# Patient Record
Sex: Female | Born: 1970 | Race: Black or African American | Hispanic: No | Marital: Single | Smoking: Never smoker
Health system: Southern US, Community
[De-identification: ages and names within clinical notes are randomized; demographics above are authoritative.]

## PROBLEM LIST (undated history)

## (undated) DIAGNOSIS — I10 Essential (primary) hypertension: Secondary | ICD-10-CM

## (undated) DIAGNOSIS — F419 Anxiety disorder, unspecified: Secondary | ICD-10-CM

## (undated) HISTORY — PX: CHOLECYSTECTOMY: SHX55

---

## 2007-01-16 ENCOUNTER — Other Ambulatory Visit: Admission: RE | Admit: 2007-01-16 | Discharge: 2007-01-16 | Payer: Self-pay | Admitting: Family Medicine

## 2008-03-15 ENCOUNTER — Other Ambulatory Visit: Admission: RE | Admit: 2008-03-15 | Discharge: 2008-03-15 | Payer: Self-pay | Admitting: Family Medicine

## 2009-03-20 ENCOUNTER — Other Ambulatory Visit: Admission: RE | Admit: 2009-03-20 | Discharge: 2009-03-20 | Payer: Self-pay | Admitting: Family Medicine

## 2011-02-22 ENCOUNTER — Other Ambulatory Visit (HOSPITAL_COMMUNITY)
Admission: RE | Admit: 2011-02-22 | Discharge: 2011-02-22 | Disposition: A | Discharge status: Home / Self Care | Payer: 59 | Source: Ambulatory Visit | Attending: Family Medicine | Admitting: Family Medicine

## 2011-02-22 DIAGNOSIS — Z124 Encounter for screening for malignant neoplasm of cervix: Secondary | ICD-10-CM | POA: Insufficient documentation

## 2012-03-03 ENCOUNTER — Other Ambulatory Visit (HOSPITAL_COMMUNITY)
Admission: RE | Admit: 2012-03-03 | Discharge: 2012-03-03 | Disposition: A | Discharge status: Home / Self Care | Payer: 59 | Source: Ambulatory Visit | Attending: Family Medicine | Admitting: Family Medicine

## 2012-03-03 DIAGNOSIS — Z124 Encounter for screening for malignant neoplasm of cervix: Secondary | ICD-10-CM | POA: Insufficient documentation

## 2012-11-06 ENCOUNTER — Inpatient Hospital Stay (HOSPITAL_COMMUNITY)
Admission: AD | Admit: 2012-11-06 | Discharge: 2012-11-06 | Disposition: A | Discharge status: Home / Self Care | Payer: 59 | Source: Ambulatory Visit | Attending: Obstetrics and Gynecology | Admitting: Obstetrics and Gynecology

## 2012-11-06 ENCOUNTER — Encounter (HOSPITAL_COMMUNITY): Payer: Self-pay | Admitting: *Deleted

## 2012-11-06 DIAGNOSIS — N946 Dysmenorrhea, unspecified: Secondary | ICD-10-CM

## 2012-11-06 DIAGNOSIS — N949 Unspecified condition associated with female genital organs and menstrual cycle: Secondary | ICD-10-CM | POA: Insufficient documentation

## 2012-11-06 DIAGNOSIS — N938 Other specified abnormal uterine and vaginal bleeding: Secondary | ICD-10-CM | POA: Insufficient documentation

## 2012-11-06 DIAGNOSIS — R51 Headache: Secondary | ICD-10-CM | POA: Insufficient documentation

## 2012-11-06 DIAGNOSIS — I10 Essential (primary) hypertension: Secondary | ICD-10-CM | POA: Insufficient documentation

## 2012-11-06 HISTORY — DX: Anxiety disorder, unspecified: F41.9

## 2012-11-06 HISTORY — DX: Essential (primary) hypertension: I10

## 2012-11-06 LAB — URINE MICROSCOPIC-ADD ON

## 2012-11-06 LAB — URINALYSIS, ROUTINE W REFLEX MICROSCOPIC
Glucose, UA: NEGATIVE mg/dL
Ketones, ur: 15 mg/dL — AB
Protein, ur: 100 mg/dL — AB

## 2012-11-06 MED ORDER — IBUPROFEN 800 MG PO TABS
800.0000 mg | ORAL_TABLET | Freq: Once | ORAL | Status: AC
  Administered 2012-11-06: 800 mg via ORAL
  Filled 2012-11-06: qty 1

## 2012-11-06 MED ORDER — AMLODIPINE BESYLATE 5 MG PO TABS: 5.0000 mg | ORAL_TABLET | Freq: Every day | ORAL | Status: AC

## 2012-11-06 MED ORDER — LABETALOL HCL 5 MG/ML IV SOLN
20.0000 mg | Freq: Once | INTRAVENOUS | Status: AC
  Administered 2012-11-06: 20 mg via INTRAVENOUS
  Filled 2012-11-06: qty 4

## 2012-11-06 MED ORDER — SODIUM CHLORIDE 0.9 % IV SOLN
INTRAVENOUS | Status: DC
  Administered 2012-11-06: 20 mL/h via INTRAVENOUS

## 2012-11-06 MED ORDER — LABETALOL HCL 5 MG/ML IV SOLN
40.0000 mg | INTRAVENOUS | Status: AC
  Administered 2012-11-06: 20 mg via INTRAVENOUS
  Administered 2012-11-06: 40 mg via INTRAVENOUS
  Filled 2012-11-06: qty 4
  Filled 2012-11-06: qty 8

## 2012-11-06 MED ORDER — LISINOPRIL 10 MG PO TABS: 20.0000 mg | ORAL_TABLET | Freq: Every day | ORAL | Status: AC

## 2012-11-06 NOTE — MAU Provider Note (Signed)
Attestation of Attending Supervision of Advanced Practitioner (CNM/NP): Evaluation and management procedures were performed by the Advanced Practitioner under my supervision and collaboration.  I have reviewed the Advanced Practitioner's note and chart, and I agree with the management and plan.  Anderson Coppock 11/06/2012 7:27 AM

## 2012-11-06 NOTE — MAU Note (Signed)
Pt started bleeding and cramping Wednesday.  LMP March 15.  Spotted most of April.

## 2012-11-06 NOTE — MAU Provider Note (Signed)
  History     CSN: [id number]  Arrival date and time: 11/06/12 4098   First Provider Initiated Contact with Patient 11/06/12 [phone]      Chief Complaint  Patient presents with  . Vaginal Bleeding  . Dysmenorrhea   HPI  Cerys Winget is a 42 y.o. G0P0 who presents today with bleeding and cramping. She was not sure if she was pregnant because her menstrual cycle has been irregular. She also reports a history of chronic hypertension. She states she was on lisinopril 20mg /day and amlodipine 5mg /day , and that her PA took her off her meds "a couple of months ago". She states that she was going to try being off her meds and seeing if she could tolerate it. She has had a headache for the last "few days".   Past Medical History  Diagnosis Date  . Hypertension   . Anxiety     Past Surgical History  Procedure Laterality Date  . Cholecystectomy      Family History  Problem Relation Age of Onset  . Hypertension Mother   . Diabetes Father   . Hypertension Father   . Cancer Father     History  Substance Use Topics  . Smoking status: Never Smoker   . Smokeless tobacco: Not on file  . Alcohol Use: No    Allergies: No Known Allergies  Prescriptions prior to admission  Medication Sig Dispense Refill  . omeprazole (PRILOSEC) 40 MG capsule Take 40 mg by mouth daily.      . sertraline (ZOLOFT) 25 MG tablet Take 25 mg by mouth daily.        Review of Systems  Eyes: Negative for blurred vision and double vision.  Respiratory: Negative for shortness of breath.   Cardiovascular: Negative for chest pain.  Gastrointestinal: Negative for nausea, vomiting and abdominal pain.  Genitourinary: Negative for dysuria, urgency and frequency.  Neurological: Positive for headaches. Negative for dizziness.   Physical Exam   Blood pressure 230/126, pulse 76, temperature 99.9 F (37.7 C), temperature source Oral, resp. rate 18, height 4\' 11"  (1.499 m), last menstrual period  09/17/2012.  Physical Exam  Nursing note and vitals reviewed. Constitutional: She is oriented to person, place, and time. She appears well-developed and well-nourished. No distress.  Cardiovascular: Normal rate.   Respiratory: Effort normal.  GI: Soft. There is no tenderness.  Neurological: She is alert and oriented to person, place, and time.  Skin: Skin is warm and dry.  Psychiatric: She has a normal mood and affect.    MAU Course  Procedures  Results for orders placed during the hospital encounter of 11/06/12 (from the past 24 hour(s))  POCT PREGNANCY, URINE     Status: None   Collection Time    11/06/12  3:41 AM      Result Value Range   Preg Test, Ur NEGATIVE  NEGATIVE    0350: Spoke with Dr. Jolayne Panther. IV labetalol 40 mg X 2 doses. If still need to bring B/P down can do 10mg  of hydralazine and 10 mins later 20 mg PRN. Once b/p is within 150-160/100 range patient can be dc home with rx for meds and to FU with PCP.   0549: B/P has been stable in the 170s/90's Assessment and Plan   1. Hypertension    Resume Lisinopril and amlodipine starting tomorrow Call PCP and inform them of update in B/P Be seen there as soon as possible.   Tawnya Crook 11/06/2012, 3:51 AM

## 2013-03-12 ENCOUNTER — Other Ambulatory Visit (HOSPITAL_COMMUNITY)
Admission: RE | Admit: 2013-03-12 | Discharge: 2013-03-12 | Disposition: A | Discharge status: Home / Self Care | Payer: 59 | Source: Ambulatory Visit | Attending: Family Medicine | Admitting: Family Medicine

## 2013-03-12 ENCOUNTER — Other Ambulatory Visit: Payer: Self-pay | Admitting: Physician Assistant

## 2013-03-12 DIAGNOSIS — Z124 Encounter for screening for malignant neoplasm of cervix: Secondary | ICD-10-CM | POA: Insufficient documentation

## 2013-10-07 ENCOUNTER — Other Ambulatory Visit (HOSPITAL_COMMUNITY): Payer: Self-pay | Admitting: Radiology

## 2013-10-07 ENCOUNTER — Ambulatory Visit (HOSPITAL_COMMUNITY): Payer: 59 | Attending: Physician Assistant | Admitting: Radiology

## 2013-10-07 DIAGNOSIS — R011 Cardiac murmur, unspecified: Secondary | ICD-10-CM | POA: Insufficient documentation

## 2013-10-07 NOTE — Progress Notes (Signed)
Echocardiogram Performed. 

## 2014-03-21 ENCOUNTER — Other Ambulatory Visit: Payer: Self-pay | Admitting: Physician Assistant

## 2014-03-21 ENCOUNTER — Other Ambulatory Visit (HOSPITAL_COMMUNITY)
Admission: RE | Admit: 2014-03-21 | Discharge: 2014-03-21 | Disposition: A | Discharge status: Home / Self Care | Payer: 59 | Source: Ambulatory Visit | Attending: Family Medicine | Admitting: Family Medicine

## 2014-03-21 DIAGNOSIS — Z124 Encounter for screening for malignant neoplasm of cervix: Secondary | ICD-10-CM | POA: Diagnosis not present

## 2014-03-22 LAB — CYTOLOGY - PAP

## 2015-04-25 ENCOUNTER — Other Ambulatory Visit: Payer: Self-pay | Admitting: Physician Assistant

## 2015-04-25 DIAGNOSIS — R928 Other abnormal and inconclusive findings on diagnostic imaging of breast: Secondary | ICD-10-CM

## 2015-05-01 ENCOUNTER — Ambulatory Visit
Admission: RE | Admit: 2015-05-01 | Discharge: 2015-05-01 | Disposition: A | Discharge status: Home / Self Care | Payer: 59 | Source: Ambulatory Visit | Attending: Physician Assistant | Admitting: Physician Assistant

## 2015-05-01 DIAGNOSIS — R928 Other abnormal and inconclusive findings on diagnostic imaging of breast: Secondary | ICD-10-CM

## 2019-09-06 ENCOUNTER — Other Ambulatory Visit: Payer: Self-pay | Admitting: Obstetrics & Gynecology

## 2019-09-06 DIAGNOSIS — Z1231 Encounter for screening mammogram for malignant neoplasm of breast: Secondary | ICD-10-CM

## 2019-10-05 ENCOUNTER — Ambulatory Visit
Admission: RE | Admit: 2019-10-05 | Discharge: 2019-10-05 | Disposition: A | Discharge status: Home / Self Care | Payer: 59 | Source: Ambulatory Visit | Attending: Obstetrics & Gynecology | Admitting: Obstetrics & Gynecology

## 2019-10-05 ENCOUNTER — Other Ambulatory Visit: Payer: Self-pay

## 2019-10-05 DIAGNOSIS — Z1231 Encounter for screening mammogram for malignant neoplasm of breast: Secondary | ICD-10-CM

## 2021-07-01 IMAGING — MG DIGITAL SCREENING BILAT W/ TOMO W/ CAD
6 of 12 series · 6 of 36 positions shown · non-contrast
Comparison: Previous exam(s).

CLINICAL DATA: Screening.

EXAM:
DIGITAL SCREENING BILATERAL MAMMOGRAM WITH TOMO AND CAD

[R MLO synth-2D]
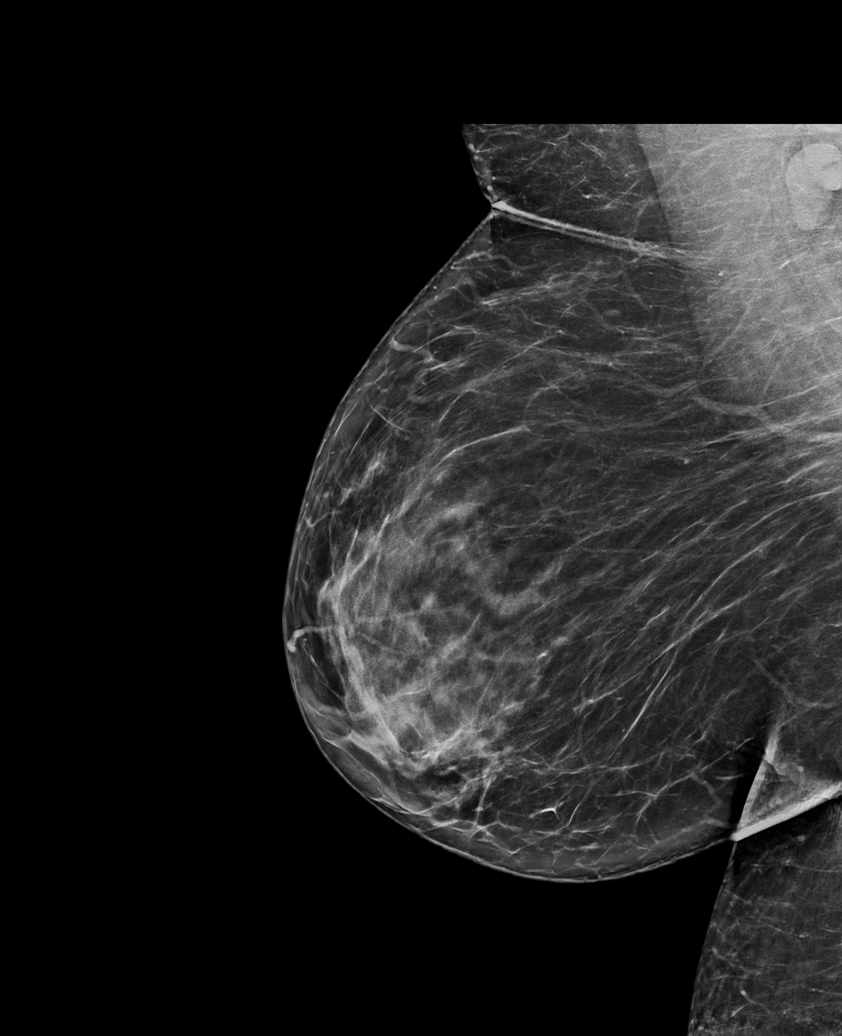

[L CC synth-2D (1 of 2)]
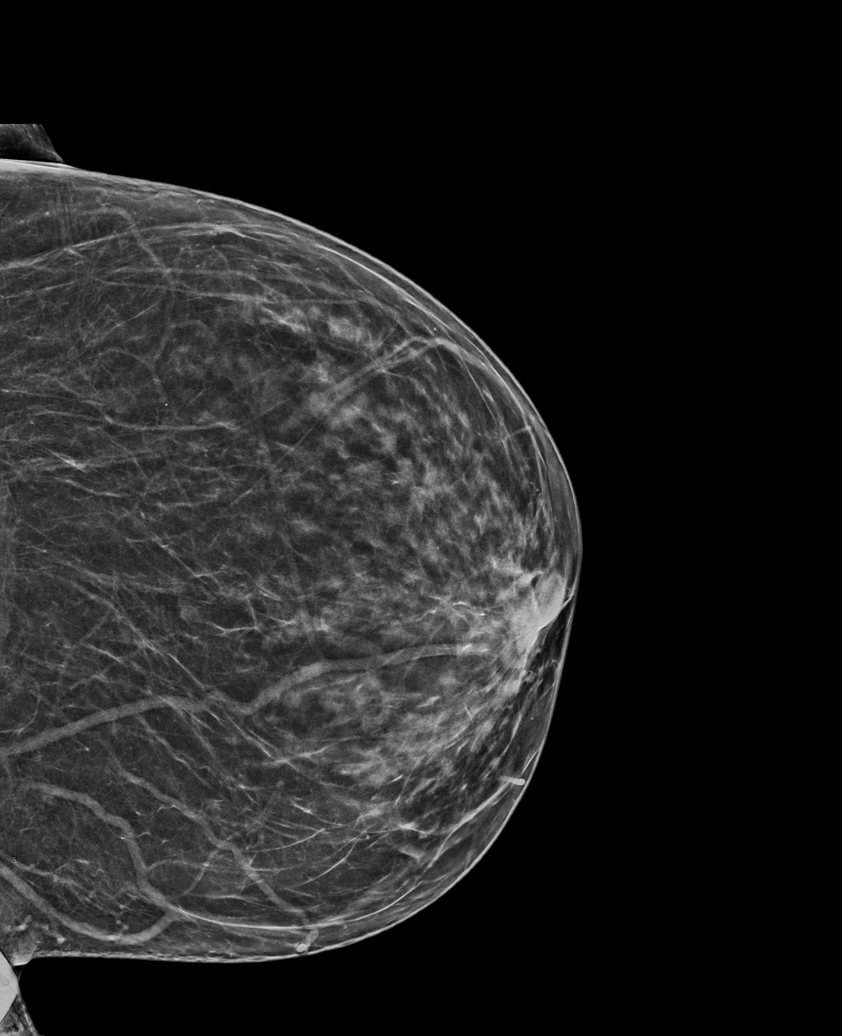

[L CC synth-2D (2 of 2)]
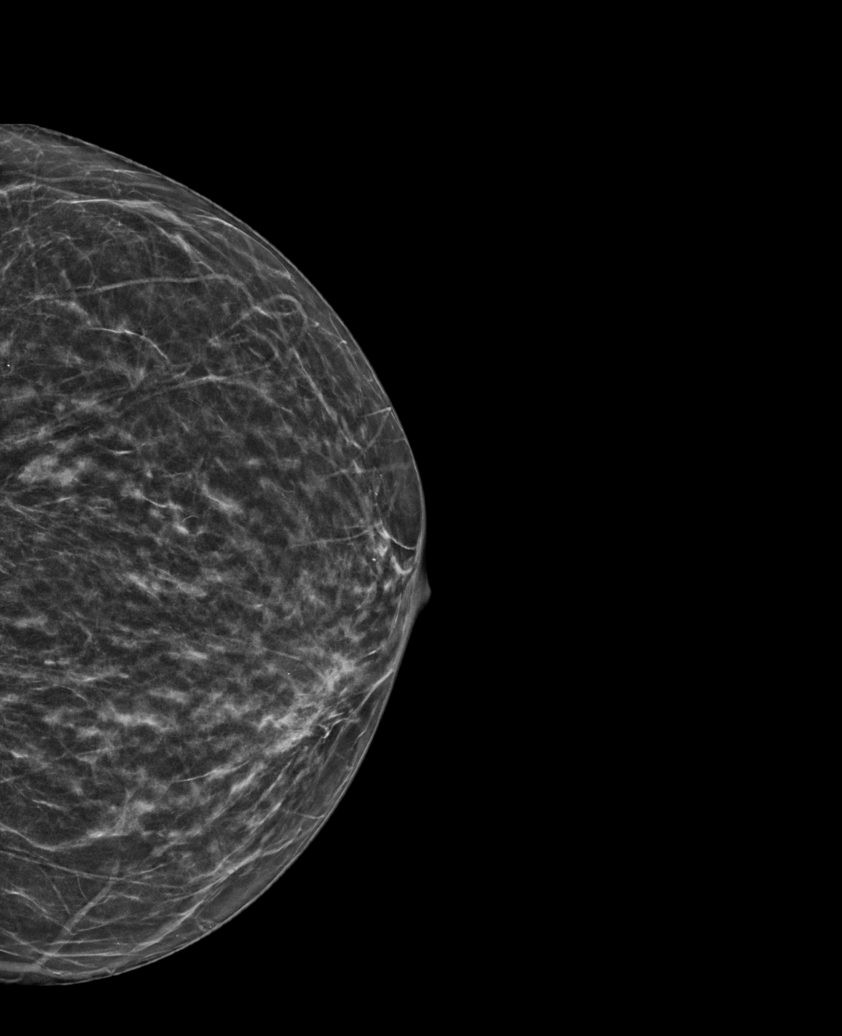

[R CC synth-2D (1 of 2)]
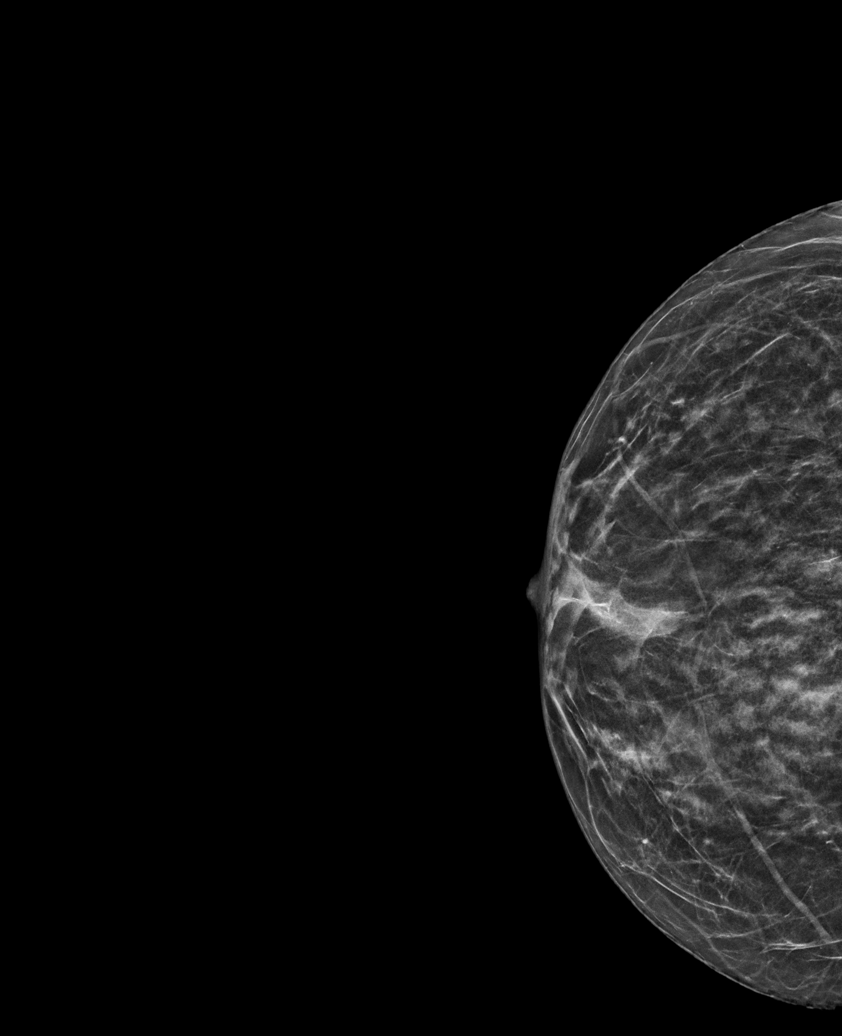

[L MLO synth-2D]
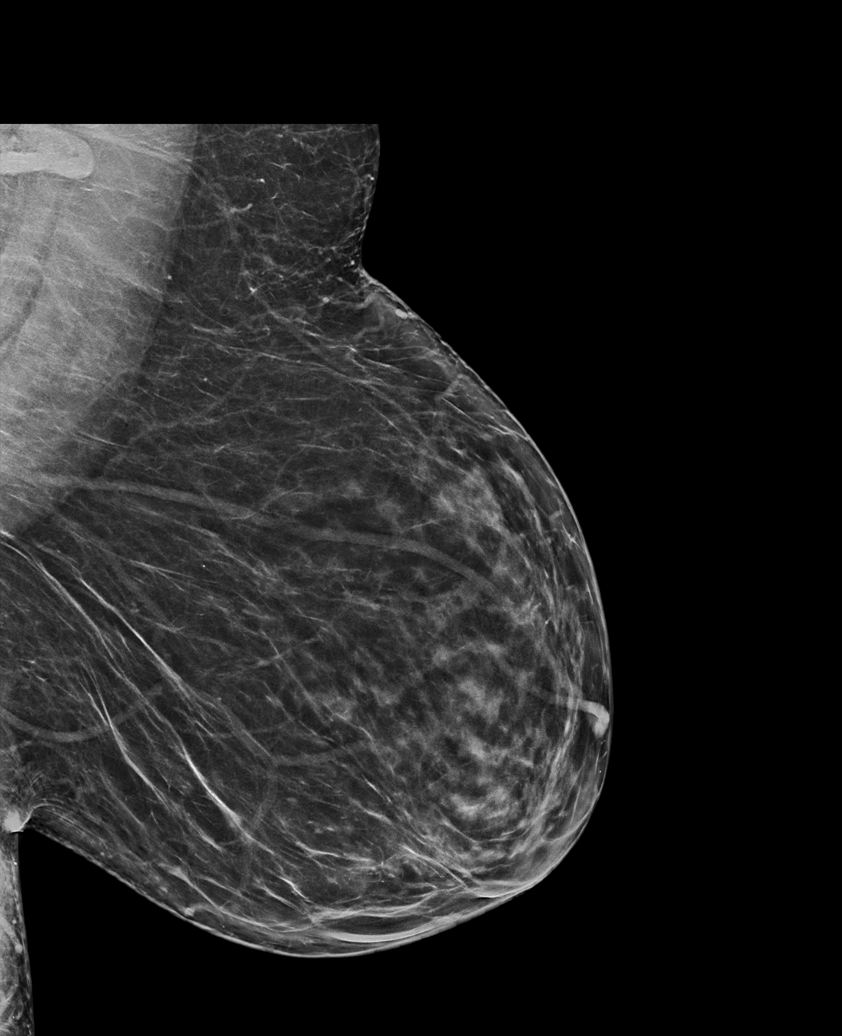

[R CC synth-2D (2 of 2)]
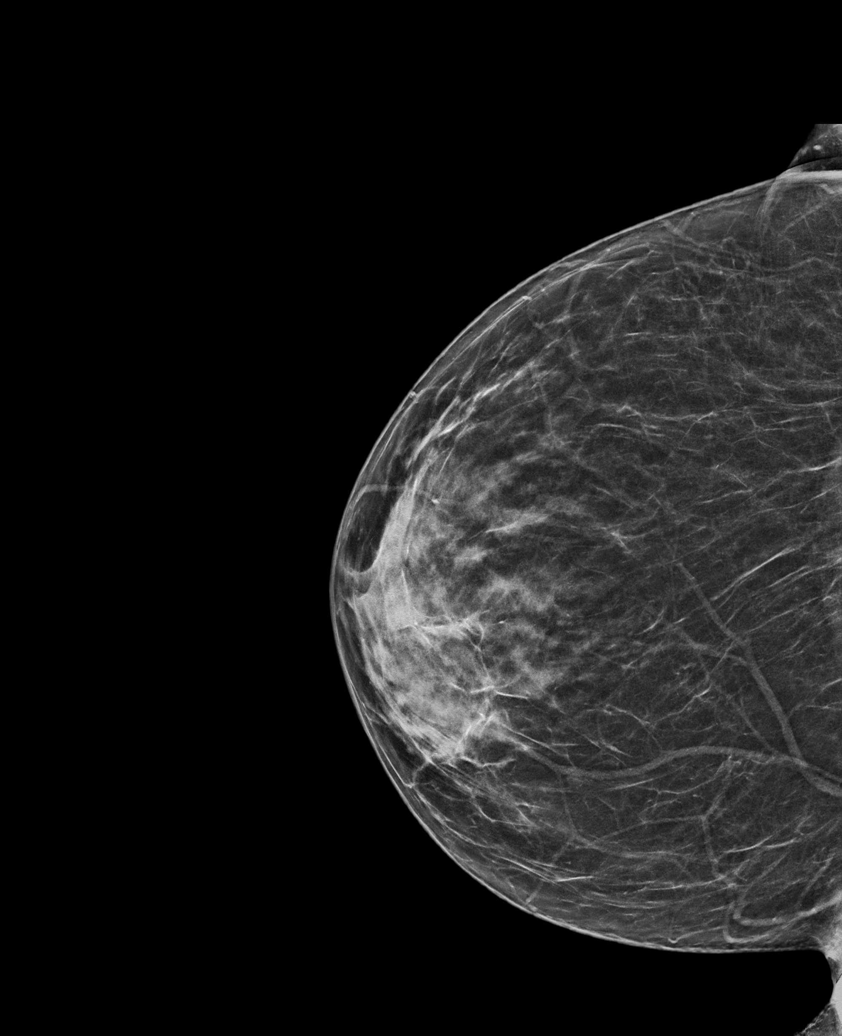

[6 of 36 positions shown; findings below may reference images not displayed]

ACR Breast Density Category c: The breast tissue is heterogeneously
dense, which may obscure small masses.
FINDINGS: There are no findings suspicious for malignancy. Images were
processed with CAD.
IMPRESSION: No mammographic evidence of malignancy. A result letter of this
screening mammogram will be mailed directly to the patient.

RECOMMENDATION:
Screening mammogram in one year. (Code:[id number])

BI-RADS CATEGORY  1: Negative.

## 2021-09-19 ENCOUNTER — Other Ambulatory Visit: Payer: Self-pay

## 2021-09-19 MED ORDER — AMLODIPINE BESYLATE 5 MG PO TABS
ORAL_TABLET | ORAL | 0 refills | Status: AC
  Filled 2021-09-19: qty 30, 30d supply, fill #0
  Filled 2021-10-29: qty 60, 60d supply, fill #1

## 2021-09-19 MED ORDER — LOSARTAN POTASSIUM-HCTZ 100-25 MG PO TABS
ORAL_TABLET | ORAL | 2 refills | Status: AC
  Filled 2021-09-19 – 2022-02-08 (×2): qty 30, 30d supply, fill #0
  Filled 2022-03-14: qty 30, 30d supply, fill #1

## 2021-09-19 MED ORDER — LOSARTAN POTASSIUM 100 MG PO TABS
ORAL_TABLET | ORAL | 1 refills | Status: AC
  Filled 2021-09-19: qty 30, 30d supply, fill #0

## 2021-09-19 MED ORDER — SERTRALINE HCL 100 MG PO TABS
ORAL_TABLET | ORAL | 1 refills | Status: AC
  Filled 2021-09-19 – 2021-10-10 (×2): qty 30, 30d supply, fill #0
  Filled 2022-02-08: qty 30, 30d supply, fill #1

## 2021-09-19 MED ORDER — NYSTATIN 100000 UNIT/GM EX POWD
CUTANEOUS | 2 refills | Status: AC
  Filled 2021-09-19: qty 60, 30d supply, fill #0

## 2021-09-19 MED ORDER — SERTRALINE HCL 50 MG PO TABS
ORAL_TABLET | ORAL | 1 refills | Status: AC
  Filled 2021-09-19: qty 30, 30d supply, fill #0

## 2021-09-19 MED ORDER — HYDROCHLOROTHIAZIDE 25 MG PO TABS
ORAL_TABLET | ORAL | 8 refills | Status: AC
  Filled 2021-09-19: qty 30, 30d supply, fill #0

## 2021-09-19 MED ORDER — PANTOPRAZOLE SODIUM 40 MG PO TBEC
40.0000 mg | DELAYED_RELEASE_TABLET | Freq: Every day | ORAL | 0 refills | Status: AC
  Filled 2021-09-19: qty 30, 30d supply, fill #0
  Filled 2022-02-08: qty 30, 30d supply, fill #1

## 2021-09-20 ENCOUNTER — Other Ambulatory Visit: Payer: Self-pay

## 2021-10-10 ENCOUNTER — Other Ambulatory Visit (HOSPITAL_COMMUNITY): Payer: Self-pay

## 2021-10-10 ENCOUNTER — Other Ambulatory Visit: Payer: Self-pay

## 2021-10-10 MED ORDER — PANTOPRAZOLE SODIUM 40 MG PO TBEC
40.0000 mg | DELAYED_RELEASE_TABLET | Freq: Two times a day (BID) | ORAL | 0 refills | Status: AC
  Filled 2021-10-10: qty 180, 90d supply, fill #0

## 2021-10-11 ENCOUNTER — Other Ambulatory Visit: Payer: Self-pay

## 2021-10-23 ENCOUNTER — Other Ambulatory Visit: Payer: Self-pay

## 2021-10-23 MED ORDER — LOSARTAN POTASSIUM-HCTZ 100-25 MG PO TABS
ORAL_TABLET | ORAL | 0 refills | Status: DC
  Filled 2021-10-23: qty 90, 90d supply, fill #0

## 2021-10-29 ENCOUNTER — Other Ambulatory Visit: Payer: Self-pay

## 2021-10-30 DIAGNOSIS — I10 Essential (primary) hypertension: Secondary | ICD-10-CM | POA: Diagnosis not present

## 2021-10-30 DIAGNOSIS — G4733 Obstructive sleep apnea (adult) (pediatric): Secondary | ICD-10-CM | POA: Diagnosis not present

## 2021-10-30 DIAGNOSIS — R69 Illness, unspecified: Secondary | ICD-10-CM | POA: Diagnosis not present

## 2021-11-02 DIAGNOSIS — G4733 Obstructive sleep apnea (adult) (pediatric): Secondary | ICD-10-CM | POA: Diagnosis not present

## 2021-11-05 DIAGNOSIS — I10 Essential (primary) hypertension: Secondary | ICD-10-CM | POA: Diagnosis not present

## 2021-11-05 DIAGNOSIS — E119 Type 2 diabetes mellitus without complications: Secondary | ICD-10-CM | POA: Diagnosis not present

## 2021-11-09 DIAGNOSIS — G4733 Obstructive sleep apnea (adult) (pediatric): Secondary | ICD-10-CM | POA: Diagnosis not present

## 2021-11-12 ENCOUNTER — Other Ambulatory Visit: Payer: Self-pay

## 2021-11-12 MED ORDER — SERTRALINE HCL 100 MG PO TABS
ORAL_TABLET | ORAL | 1 refills | Status: AC
  Filled 2021-11-12 – 2022-01-12 (×2): qty 30, 30d supply, fill #0
  Filled 2022-01-14 – 2022-03-14 (×2): qty 30, 30d supply, fill #1
  Filled 2022-04-21: qty 30, 30d supply, fill #2
  Filled 2022-05-23: qty 30, 30d supply, fill #3

## 2021-11-19 ENCOUNTER — Other Ambulatory Visit: Payer: Self-pay

## 2021-12-02 DIAGNOSIS — G4733 Obstructive sleep apnea (adult) (pediatric): Secondary | ICD-10-CM | POA: Diagnosis not present

## 2021-12-10 DIAGNOSIS — G4733 Obstructive sleep apnea (adult) (pediatric): Secondary | ICD-10-CM | POA: Diagnosis not present

## 2021-12-10 DIAGNOSIS — I10 Essential (primary) hypertension: Secondary | ICD-10-CM | POA: Diagnosis not present

## 2021-12-10 DIAGNOSIS — R69 Illness, unspecified: Secondary | ICD-10-CM | POA: Diagnosis not present

## 2021-12-25 DIAGNOSIS — R140 Abdominal distension (gaseous): Secondary | ICD-10-CM | POA: Diagnosis not present

## 2021-12-25 DIAGNOSIS — Z789 Other specified health status: Secondary | ICD-10-CM | POA: Diagnosis not present

## 2022-01-02 DIAGNOSIS — G4733 Obstructive sleep apnea (adult) (pediatric): Secondary | ICD-10-CM | POA: Diagnosis not present

## 2022-01-03 DIAGNOSIS — R109 Unspecified abdominal pain: Secondary | ICD-10-CM | POA: Diagnosis not present

## 2022-01-03 DIAGNOSIS — R197 Diarrhea, unspecified: Secondary | ICD-10-CM | POA: Diagnosis not present

## 2022-01-04 DIAGNOSIS — R109 Unspecified abdominal pain: Secondary | ICD-10-CM | POA: Diagnosis not present

## 2022-01-04 DIAGNOSIS — R140 Abdominal distension (gaseous): Secondary | ICD-10-CM | POA: Diagnosis not present

## 2022-01-07 ENCOUNTER — Other Ambulatory Visit: Payer: Self-pay | Admitting: Family Medicine

## 2022-01-07 DIAGNOSIS — R140 Abdominal distension (gaseous): Secondary | ICD-10-CM

## 2022-01-11 ENCOUNTER — Ambulatory Visit
Admission: RE | Admit: 2022-01-11 | Discharge: 2022-01-11 | Disposition: A | Discharge status: Home / Self Care | Payer: 59 | Source: Ambulatory Visit | Attending: Family Medicine | Admitting: Family Medicine

## 2022-01-11 DIAGNOSIS — R140 Abdominal distension (gaseous): Secondary | ICD-10-CM

## 2022-01-12 ENCOUNTER — Other Ambulatory Visit (HOSPITAL_COMMUNITY): Payer: Self-pay

## 2022-01-14 ENCOUNTER — Other Ambulatory Visit (HOSPITAL_COMMUNITY): Payer: Self-pay

## 2022-01-15 ENCOUNTER — Other Ambulatory Visit (HOSPITAL_COMMUNITY): Payer: Self-pay

## 2022-01-21 DIAGNOSIS — R109 Unspecified abdominal pain: Secondary | ICD-10-CM | POA: Diagnosis not present

## 2022-01-21 DIAGNOSIS — E118 Type 2 diabetes mellitus with unspecified complications: Secondary | ICD-10-CM | POA: Diagnosis not present

## 2022-01-21 DIAGNOSIS — E8881 Metabolic syndrome: Secondary | ICD-10-CM | POA: Diagnosis not present

## 2022-01-21 DIAGNOSIS — E559 Vitamin D deficiency, unspecified: Secondary | ICD-10-CM | POA: Diagnosis not present

## 2022-01-21 DIAGNOSIS — G4733 Obstructive sleep apnea (adult) (pediatric): Secondary | ICD-10-CM | POA: Diagnosis not present

## 2022-01-21 DIAGNOSIS — R946 Abnormal results of thyroid function studies: Secondary | ICD-10-CM | POA: Diagnosis not present

## 2022-01-21 DIAGNOSIS — R69 Illness, unspecified: Secondary | ICD-10-CM | POA: Diagnosis not present

## 2022-01-21 DIAGNOSIS — D649 Anemia, unspecified: Secondary | ICD-10-CM | POA: Diagnosis not present

## 2022-01-21 DIAGNOSIS — M549 Dorsalgia, unspecified: Secondary | ICD-10-CM | POA: Diagnosis not present

## 2022-01-21 DIAGNOSIS — I10 Essential (primary) hypertension: Secondary | ICD-10-CM | POA: Diagnosis not present

## 2022-01-24 DIAGNOSIS — G4733 Obstructive sleep apnea (adult) (pediatric): Secondary | ICD-10-CM | POA: Diagnosis not present

## 2022-02-01 DIAGNOSIS — G4733 Obstructive sleep apnea (adult) (pediatric): Secondary | ICD-10-CM | POA: Diagnosis not present

## 2022-02-04 DIAGNOSIS — R109 Unspecified abdominal pain: Secondary | ICD-10-CM | POA: Diagnosis not present

## 2022-02-04 DIAGNOSIS — R140 Abdominal distension (gaseous): Secondary | ICD-10-CM | POA: Diagnosis not present

## 2022-02-04 DIAGNOSIS — R946 Abnormal results of thyroid function studies: Secondary | ICD-10-CM | POA: Diagnosis not present

## 2022-02-08 ENCOUNTER — Other Ambulatory Visit (HOSPITAL_COMMUNITY): Payer: Self-pay

## 2022-02-08 MED ORDER — AMLODIPINE BESYLATE 5 MG PO TABS
ORAL_TABLET | ORAL | 1 refills | Status: AC
  Filled 2022-02-08: qty 30, 30d supply, fill #0
  Filled 2022-03-14: qty 30, 30d supply, fill #1
  Filled 2022-11-11 – 2022-11-18 (×3): qty 30, 30d supply, fill #2
  Filled 2022-12-20: qty 30, 30d supply, fill #3

## 2022-02-13 ENCOUNTER — Other Ambulatory Visit: Payer: Self-pay

## 2022-02-13 ENCOUNTER — Other Ambulatory Visit (HOSPITAL_COMMUNITY): Payer: Self-pay

## 2022-02-18 DIAGNOSIS — I10 Essential (primary) hypertension: Secondary | ICD-10-CM | POA: Diagnosis not present

## 2022-02-18 DIAGNOSIS — G4733 Obstructive sleep apnea (adult) (pediatric): Secondary | ICD-10-CM | POA: Diagnosis not present

## 2022-02-18 DIAGNOSIS — R69 Illness, unspecified: Secondary | ICD-10-CM | POA: Diagnosis not present

## 2022-02-25 DIAGNOSIS — R140 Abdominal distension (gaseous): Secondary | ICD-10-CM | POA: Diagnosis not present

## 2022-02-25 DIAGNOSIS — E8881 Metabolic syndrome: Secondary | ICD-10-CM | POA: Diagnosis not present

## 2022-02-25 DIAGNOSIS — R1084 Generalized abdominal pain: Secondary | ICD-10-CM | POA: Diagnosis not present

## 2022-02-25 DIAGNOSIS — E559 Vitamin D deficiency, unspecified: Secondary | ICD-10-CM | POA: Diagnosis not present

## 2022-02-25 DIAGNOSIS — M549 Dorsalgia, unspecified: Secondary | ICD-10-CM | POA: Diagnosis not present

## 2022-02-25 DIAGNOSIS — F419 Anxiety disorder, unspecified: Secondary | ICD-10-CM | POA: Diagnosis not present

## 2022-02-25 DIAGNOSIS — I10 Essential (primary) hypertension: Secondary | ICD-10-CM | POA: Diagnosis not present

## 2022-02-25 DIAGNOSIS — D509 Iron deficiency anemia, unspecified: Secondary | ICD-10-CM | POA: Diagnosis not present

## 2022-02-25 DIAGNOSIS — G4733 Obstructive sleep apnea (adult) (pediatric): Secondary | ICD-10-CM | POA: Diagnosis not present

## 2022-02-25 DIAGNOSIS — R946 Abnormal results of thyroid function studies: Secondary | ICD-10-CM | POA: Diagnosis not present

## 2022-02-25 DIAGNOSIS — E118 Type 2 diabetes mellitus with unspecified complications: Secondary | ICD-10-CM | POA: Diagnosis not present

## 2022-02-28 ENCOUNTER — Other Ambulatory Visit: Payer: Self-pay

## 2022-03-14 ENCOUNTER — Other Ambulatory Visit: Payer: Self-pay

## 2022-03-14 ENCOUNTER — Other Ambulatory Visit (HOSPITAL_COMMUNITY): Payer: Self-pay

## 2022-03-14 MED ORDER — PANTOPRAZOLE SODIUM 40 MG PO TBEC
40.0000 mg | DELAYED_RELEASE_TABLET | Freq: Every day | ORAL | 4 refills | Status: AC
  Filled 2022-03-14 – 2022-04-21 (×2): qty 30, 30d supply, fill #0
  Filled 2022-05-23: qty 30, 30d supply, fill #1
  Filled 2022-06-19: qty 30, 30d supply, fill #2
  Filled 2022-07-20: qty 30, 30d supply, fill #3
  Filled 2022-08-30: qty 30, 30d supply, fill #0
  Filled 2022-08-30: qty 30, 30d supply, fill #4

## 2022-03-14 MED ORDER — AMLODIPINE BESYLATE 5 MG PO TABS
5.0000 mg | ORAL_TABLET | Freq: Every day | ORAL | 4 refills | Status: DC
  Filled 2022-04-21: qty 30, 30d supply, fill #0
  Filled 2022-05-23: qty 30, 30d supply, fill #1
  Filled 2022-08-30 (×2): qty 30, 30d supply, fill #2
  Filled 2022-10-05: qty 30, 30d supply, fill #3
  Filled 2022-11-11: qty 30, 30d supply, fill #4

## 2022-03-15 ENCOUNTER — Other Ambulatory Visit: Payer: Self-pay

## 2022-03-15 ENCOUNTER — Other Ambulatory Visit (HOSPITAL_COMMUNITY): Payer: Self-pay

## 2022-03-15 MED ORDER — PANTOPRAZOLE SODIUM 40 MG PO TBEC
40.0000 mg | DELAYED_RELEASE_TABLET | Freq: Two times a day (BID) | ORAL | 0 refills | Status: DC
  Filled 2022-03-15: qty 60, 30d supply, fill #0
  Filled 2022-09-19: qty 60, 30d supply, fill #1
  Filled 2022-10-29: qty 60, 30d supply, fill #2

## 2022-03-19 ENCOUNTER — Other Ambulatory Visit (HOSPITAL_COMMUNITY): Payer: Self-pay

## 2022-04-01 ENCOUNTER — Other Ambulatory Visit (HOSPITAL_COMMUNITY): Payer: Self-pay

## 2022-04-03 ENCOUNTER — Other Ambulatory Visit: Payer: Self-pay

## 2022-04-21 ENCOUNTER — Other Ambulatory Visit (HOSPITAL_COMMUNITY): Payer: Self-pay

## 2022-04-22 ENCOUNTER — Other Ambulatory Visit (HOSPITAL_COMMUNITY): Payer: Self-pay

## 2022-04-25 ENCOUNTER — Other Ambulatory Visit (HOSPITAL_COMMUNITY): Payer: Self-pay

## 2022-04-27 ENCOUNTER — Other Ambulatory Visit (HOSPITAL_COMMUNITY): Payer: Self-pay

## 2022-04-29 ENCOUNTER — Other Ambulatory Visit (HOSPITAL_COMMUNITY): Payer: Self-pay

## 2022-04-29 MED ORDER — LOSARTAN POTASSIUM-HCTZ 100-25 MG PO TABS
1.0000 | ORAL_TABLET | Freq: Every day | ORAL | 0 refills | Status: DC
Start: 2022-04-28 — End: 2022-12-24
  Filled 2022-04-29 – 2022-05-23 (×2): qty 30, 30d supply, fill #0

## 2022-05-02 ENCOUNTER — Other Ambulatory Visit (HOSPITAL_COMMUNITY): Payer: Self-pay

## 2022-05-11 ENCOUNTER — Other Ambulatory Visit (HOSPITAL_COMMUNITY): Payer: Self-pay

## 2022-05-23 ENCOUNTER — Other Ambulatory Visit (HOSPITAL_COMMUNITY): Payer: Self-pay

## 2022-06-25 ENCOUNTER — Other Ambulatory Visit: Payer: Self-pay

## 2022-07-20 ENCOUNTER — Other Ambulatory Visit: Payer: Self-pay

## 2022-07-22 ENCOUNTER — Other Ambulatory Visit (HOSPITAL_COMMUNITY): Payer: Self-pay

## 2022-07-24 ENCOUNTER — Other Ambulatory Visit (HOSPITAL_COMMUNITY): Payer: Self-pay

## 2022-08-07 ENCOUNTER — Other Ambulatory Visit: Payer: Self-pay

## 2022-08-08 ENCOUNTER — Other Ambulatory Visit: Payer: Self-pay

## 2022-08-08 ENCOUNTER — Other Ambulatory Visit (HOSPITAL_COMMUNITY): Payer: Self-pay

## 2022-08-08 MED ORDER — TRULICITY 0.75 MG/0.5ML ~~LOC~~ SOAJ
SUBCUTANEOUS | 1 refills | Status: AC
  Filled 2022-08-08: qty 2, 28d supply, fill #0
  Filled 2022-09-08 – 2022-09-09 (×2): qty 2, 28d supply, fill #1

## 2022-08-14 ENCOUNTER — Other Ambulatory Visit: Payer: Self-pay

## 2022-08-30 ENCOUNTER — Other Ambulatory Visit (HOSPITAL_COMMUNITY): Payer: Self-pay

## 2022-08-30 ENCOUNTER — Other Ambulatory Visit: Payer: Self-pay

## 2022-09-09 ENCOUNTER — Other Ambulatory Visit: Payer: Self-pay

## 2022-09-11 ENCOUNTER — Other Ambulatory Visit: Payer: Self-pay

## 2022-09-19 ENCOUNTER — Other Ambulatory Visit (HOSPITAL_COMMUNITY): Payer: Self-pay

## 2022-10-05 ENCOUNTER — Other Ambulatory Visit (HOSPITAL_COMMUNITY): Payer: Self-pay

## 2022-10-06 ENCOUNTER — Other Ambulatory Visit (HOSPITAL_COMMUNITY): Payer: Self-pay

## 2022-10-07 ENCOUNTER — Other Ambulatory Visit (HOSPITAL_COMMUNITY): Payer: Self-pay

## 2022-10-07 ENCOUNTER — Other Ambulatory Visit: Payer: Self-pay

## 2022-10-07 MED ORDER — TRULICITY 0.75 MG/0.5ML ~~LOC~~ SOAJ
0.7500 mg | SUBCUTANEOUS | 3 refills | Status: AC
  Filled 2022-10-07 – 2022-11-26 (×7): qty 2, 28d supply, fill #0
  Filled 2022-12-20 – 2022-12-27 (×2): qty 2, 28d supply, fill #1
  Filled 2023-04-08 (×2): qty 2, 28d supply, fill #2

## 2022-10-11 ENCOUNTER — Other Ambulatory Visit (HOSPITAL_COMMUNITY): Payer: Self-pay

## 2022-10-11 MED ORDER — CLINDAMYCIN PHOSPHATE 1 % EX LOTN
TOPICAL_LOTION | CUTANEOUS | 0 refills | Status: AC
  Filled 2022-10-11: qty 60, 30d supply, fill #0

## 2022-10-12 ENCOUNTER — Other Ambulatory Visit (HOSPITAL_COMMUNITY): Payer: Self-pay

## 2022-10-14 ENCOUNTER — Other Ambulatory Visit: Payer: Self-pay

## 2022-10-14 ENCOUNTER — Other Ambulatory Visit (HOSPITAL_COMMUNITY): Payer: Self-pay

## 2022-10-17 ENCOUNTER — Other Ambulatory Visit (HOSPITAL_COMMUNITY): Payer: Self-pay

## 2022-10-17 ENCOUNTER — Other Ambulatory Visit: Payer: Self-pay

## 2022-10-30 ENCOUNTER — Other Ambulatory Visit (HOSPITAL_COMMUNITY): Payer: Self-pay

## 2022-11-06 ENCOUNTER — Other Ambulatory Visit (HOSPITAL_COMMUNITY): Payer: Self-pay

## 2022-11-08 ENCOUNTER — Other Ambulatory Visit: Payer: Self-pay

## 2022-11-08 ENCOUNTER — Other Ambulatory Visit (HOSPITAL_COMMUNITY): Payer: Self-pay

## 2022-11-11 ENCOUNTER — Other Ambulatory Visit: Payer: Self-pay

## 2022-11-11 ENCOUNTER — Encounter: Payer: Self-pay | Admitting: Pharmacist

## 2022-11-15 ENCOUNTER — Other Ambulatory Visit: Payer: Self-pay

## 2022-11-18 ENCOUNTER — Other Ambulatory Visit (HOSPITAL_COMMUNITY): Payer: Self-pay

## 2022-11-19 ENCOUNTER — Other Ambulatory Visit: Payer: Self-pay

## 2022-11-26 ENCOUNTER — Other Ambulatory Visit: Payer: Self-pay

## 2022-12-20 ENCOUNTER — Other Ambulatory Visit (HOSPITAL_COMMUNITY): Payer: Self-pay

## 2022-12-23 ENCOUNTER — Encounter: Payer: Self-pay | Admitting: Pharmacist

## 2022-12-23 ENCOUNTER — Other Ambulatory Visit: Payer: Self-pay

## 2022-12-24 ENCOUNTER — Other Ambulatory Visit (HOSPITAL_COMMUNITY): Payer: Self-pay

## 2022-12-24 ENCOUNTER — Other Ambulatory Visit: Payer: Self-pay

## 2022-12-24 MED ORDER — LOSARTAN POTASSIUM-HCTZ 100-25 MG PO TABS
1.0000 | ORAL_TABLET | Freq: Every day | ORAL | 0 refills | Status: AC
Start: 2022-12-23 — End: ?
  Filled 2022-12-24: qty 30, 30d supply, fill #0

## 2022-12-26 ENCOUNTER — Other Ambulatory Visit: Payer: Self-pay

## 2022-12-27 ENCOUNTER — Other Ambulatory Visit (HOSPITAL_COMMUNITY): Payer: Self-pay

## 2022-12-30 ENCOUNTER — Other Ambulatory Visit: Payer: Self-pay

## 2022-12-31 ENCOUNTER — Other Ambulatory Visit (HOSPITAL_COMMUNITY): Payer: Self-pay

## 2022-12-31 ENCOUNTER — Other Ambulatory Visit: Payer: Self-pay

## 2022-12-31 MED ORDER — PANTOPRAZOLE SODIUM 40 MG PO TBEC
40.0000 mg | DELAYED_RELEASE_TABLET | Freq: Two times a day (BID) | ORAL | 0 refills | Status: AC
  Filled 2022-12-31: qty 60, 30d supply, fill #0

## 2023-04-08 ENCOUNTER — Other Ambulatory Visit: Payer: Self-pay

## 2023-04-08 ENCOUNTER — Other Ambulatory Visit (HOSPITAL_COMMUNITY): Payer: Self-pay

## 2023-04-09 ENCOUNTER — Other Ambulatory Visit: Payer: Self-pay

## 2023-04-09 ENCOUNTER — Other Ambulatory Visit (HOSPITAL_COMMUNITY): Payer: Self-pay

## 2023-05-27 ENCOUNTER — Other Ambulatory Visit: Payer: Self-pay

## 2023-06-26 ENCOUNTER — Other Ambulatory Visit (HOSPITAL_COMMUNITY): Payer: Self-pay

## 2023-08-28 ENCOUNTER — Other Ambulatory Visit (HOSPITAL_COMMUNITY): Payer: Self-pay
